# Patient Record
Sex: Male | Born: 1985 | Race: Black or African American | Hispanic: No | Marital: Single | Smoking: Never smoker
Health system: Southern US, Community
[De-identification: ages and names within clinical notes are randomized; demographics above are authoritative.]

## PROBLEM LIST (undated history)

## (undated) DIAGNOSIS — B20 Human immunodeficiency virus [HIV] disease: Secondary | ICD-10-CM

## (undated) DIAGNOSIS — R509 Fever, unspecified: Secondary | ICD-10-CM

## (undated) DIAGNOSIS — IMO0002 Reserved for concepts with insufficient information to code with codable children: Secondary | ICD-10-CM

## (undated) DIAGNOSIS — S022XXA Fracture of nasal bones, initial encounter for closed fracture: Secondary | ICD-10-CM

## (undated) HISTORY — DX: Human immunodeficiency virus (HIV) disease: B20

## (undated) HISTORY — DX: Fracture of nasal bones, initial encounter for closed fracture: S02.2XXA

## (undated) HISTORY — DX: Reserved for concepts with insufficient information to code with codable children: IMO0002

## (undated) HISTORY — DX: Fever, unspecified: R50.9

---
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
## 2009-01-02 ENCOUNTER — Emergency Department (HOSPITAL_COMMUNITY): Admission: EM | Admit: 2009-01-02 | Discharge: 2009-01-02 | Payer: Self-pay | Admitting: Emergency Medicine

## 2009-01-14 DIAGNOSIS — B20 Human immunodeficiency virus [HIV] disease: Secondary | ICD-10-CM

## 2009-01-14 HISTORY — DX: Human immunodeficiency virus (HIV) disease: B20

## 2009-01-15 ENCOUNTER — Emergency Department (HOSPITAL_COMMUNITY): Admission: EM | Admit: 2009-01-15 | Discharge: 2009-01-16 | Payer: Self-pay | Admitting: Emergency Medicine

## 2009-08-27 ENCOUNTER — Emergency Department (HOSPITAL_COMMUNITY): Admission: EM | Admit: 2009-08-27 | Discharge: 2009-08-27 | Payer: Self-pay | Admitting: Emergency Medicine

## 2009-11-20 ENCOUNTER — Emergency Department (HOSPITAL_COMMUNITY): Admission: EM | Admit: 2009-11-20 | Discharge: 2009-11-20 | Payer: Self-pay | Admitting: Emergency Medicine

## 2009-11-25 ENCOUNTER — Emergency Department (HOSPITAL_COMMUNITY): Admission: EM | Admit: 2009-11-25 | Discharge: 2009-11-25 | Payer: Self-pay | Admitting: Emergency Medicine

## 2009-11-28 ENCOUNTER — Ambulatory Visit: Payer: Self-pay | Admitting: Family Medicine

## 2009-11-28 ENCOUNTER — Observation Stay (HOSPITAL_COMMUNITY): Admission: EM | Admit: 2009-11-28 | Discharge: 2009-11-28 | Payer: Self-pay | Admitting: Emergency Medicine

## 2010-02-13 ENCOUNTER — Emergency Department (HOSPITAL_COMMUNITY)
Admission: EM | Admit: 2010-02-13 | Discharge: 2010-02-13 | Payer: Self-pay | Source: Home / Self Care | Admitting: Emergency Medicine

## 2010-02-23 ENCOUNTER — Telehealth: Payer: Self-pay

## 2010-02-23 DIAGNOSIS — B20 Human immunodeficiency virus [HIV] disease: Secondary | ICD-10-CM | POA: Insufficient documentation

## 2010-02-26 ENCOUNTER — Other Ambulatory Visit: Payer: Self-pay

## 2010-02-27 ENCOUNTER — Other Ambulatory Visit: Payer: Self-pay | Admitting: Adult Health

## 2010-02-27 ENCOUNTER — Other Ambulatory Visit (INDEPENDENT_AMBULATORY_CARE_PROVIDER_SITE_OTHER): Payer: Self-pay

## 2010-02-27 ENCOUNTER — Encounter: Payer: Self-pay | Admitting: Adult Health

## 2010-02-27 DIAGNOSIS — B20 Human immunodeficiency virus [HIV] disease: Secondary | ICD-10-CM

## 2010-02-27 LAB — CONVERTED CEMR LAB
Leukocytes, UA: NEGATIVE
Protein, ur: NEGATIVE mg/dL
Specific Gravity, Urine: 1.023 (ref 1.005–1.030)
Urine Glucose: NEGATIVE mg/dL
pH: 6 (ref 5.0–8.0)

## 2010-02-28 LAB — T-HELPER CELL (CD4) - (RCID CLINIC ONLY)
CD4 % Helper T Cell: 29 % — ABNORMAL LOW (ref 33–55)
CD4 T Cell Abs: 340 uL — ABNORMAL LOW (ref 400–2700)

## 2010-03-01 LAB — CONVERTED CEMR LAB
ALT: 20 units/L (ref 0–53)
Albumin: 4.3 g/dL (ref 3.5–5.2)
Basophils Relative: 0 % (ref 0–1)
CO2: 25 meq/L (ref 19–32)
Chloride: 107 meq/L (ref 96–112)
Eosinophils Absolute: 0.1 10*3/uL (ref 0.0–0.7)
Hep B Core Total Ab: NEGATIVE
Hepatitis B Surface Ag: NEGATIVE
Lymphs Abs: 1.2 10*3/uL (ref 0.7–4.0)
MCV: 86.2 fL (ref 78.0–100.0)
Neutrophils Relative %: 45 % (ref 43–77)
Platelets: 287 10*3/uL (ref 150–400)
Potassium: 3.8 meq/L (ref 3.5–5.3)
Sodium: 141 meq/L (ref 135–145)
Total Bilirubin: 0.3 mg/dL (ref 0.3–1.2)
Total Protein: 7.2 g/dL (ref 6.0–8.3)
WBC: 2.9 10*3/uL — ABNORMAL LOW (ref 4.0–10.5)

## 2010-03-01 NOTE — Progress Notes (Signed)
Summary: Transferring from [facility]   Phone Note Call from Patient   Summary of Call: Transfer from [facility]  Pt will be receiving medical care here and participating in study at [facility] . No intake , labs only.  Tomasita Morrow RN  February 23, 2010 3:13 PM   New Problems: HIV INFECTION (ICD-042)   New Problems: HIV INFECTION (ICD-042)

## 2010-03-12 ENCOUNTER — Ambulatory Visit: Payer: Self-pay | Admitting: Adult Health

## 2010-03-20 ENCOUNTER — Encounter: Payer: Self-pay | Admitting: Adult Health

## 2010-03-20 ENCOUNTER — Ambulatory Visit (INDEPENDENT_AMBULATORY_CARE_PROVIDER_SITE_OTHER): Payer: Self-pay | Admitting: Adult Health

## 2010-03-20 DIAGNOSIS — B20 Human immunodeficiency virus [HIV] disease: Secondary | ICD-10-CM

## 2010-03-20 LAB — CONVERTED CEMR LAB

## 2010-03-22 ENCOUNTER — Ambulatory Visit: Payer: Self-pay | Admitting: Adult Health

## 2010-03-27 LAB — DIFFERENTIAL
Basophils Relative: 1 % (ref 0–1)
Eosinophils Absolute: 0 10*3/uL (ref 0.0–0.7)
Eosinophils Absolute: 0 10*3/uL (ref 0.0–0.7)
Eosinophils Relative: 0 % (ref 0–5)
Eosinophils Relative: 1 % (ref 0–5)
Lymphs Abs: 0.8 10*3/uL (ref 0.7–4.0)
Monocytes Absolute: 0.6 10*3/uL (ref 0.1–1.0)
Monocytes Relative: 16 % — ABNORMAL HIGH (ref 3–12)
Monocytes Relative: 24 % — ABNORMAL HIGH (ref 3–12)
Neutrophils Relative %: 44 % (ref 43–77)

## 2010-03-27 LAB — BASIC METABOLIC PANEL
CO2: 27 mEq/L (ref 19–32)
Chloride: 103 mEq/L (ref 96–112)
GFR calc Af Amer: >60 mL/min (ref >60)
Sodium: 137 mEq/L (ref 135–145)

## 2010-03-27 LAB — URINALYSIS, ROUTINE W REFLEX MICROSCOPIC
Bilirubin Urine: NEGATIVE
Glucose, UA: NEGATIVE mg/dL
Ketones, ur: 15 mg/dL — AB
pH: 7.5 (ref 5.0–8.0)

## 2010-03-27 LAB — CBC
Hemoglobin: 14 g/dL (ref 13.0–17.0)
Hemoglobin: 14.6 g/dL (ref 13.0–17.0)
MCH: 28 pg (ref 26.0–34.0)
MCH: 28.2 pg (ref 26.0–34.0)
MCHC: 34.2 g/dL (ref 30.0–36.0)
MCV: 81.8 fL (ref 78.0–100.0)
MCV: 86.5 fL (ref 78.0–100.0)
RBC: 4.96 MIL/uL (ref 4.22–5.81)

## 2010-03-27 LAB — POCT I-STAT, CHEM 8
Calcium, Ion: 1.14 mmol/L (ref 1.12–1.32)
Creatinine, Ser: 0.9 mg/dL (ref 0.4–1.5)
Glucose, Bld: 87 mg/dL (ref 70–99)
HCT: 47 % (ref 39.0–52.0)
Hemoglobin: 16 g/dL (ref 13.0–17.0)
Potassium: 3.8 mEq/L (ref 3.5–5.1)
TCO2: 32 mmol/L (ref 0–100)

## 2010-03-27 LAB — URINE CULTURE: Culture: NO GROWTH

## 2010-03-27 LAB — URINE MICROSCOPIC-ADD ON

## 2010-04-01 LAB — DIFFERENTIAL
Basophils Absolute: 0 10*3/uL (ref 0.0–0.1)
Eosinophils Absolute: 0.1 10*3/uL (ref 0.0–0.7)
Eosinophils Relative: 1 % (ref 0–5)
Lymphs Abs: 1.3 10*3/uL (ref 0.7–4.0)
Neutrophils Relative %: 55 % (ref 43–77)

## 2010-04-01 LAB — COMPREHENSIVE METABOLIC PANEL
ALT: 11 U/L (ref 0–53)
AST: 21 U/L (ref 0–37)
CO2: 26 mEq/L (ref 19–32)
Calcium: 8.3 mg/dL — ABNORMAL LOW (ref 8.4–10.5)
Chloride: 108 mEq/L (ref 96–112)
Creatinine, Ser: 0.76 mg/dL (ref 0.4–1.5)
GFR calc Af Amer: >60 mL/min (ref >60)
GFR calc non Af Amer: >60 mL/min (ref >60)
Glucose, Bld: 93 mg/dL (ref 70–99)
Sodium: 139 mEq/L (ref 135–145)
Total Bilirubin: 0.6 mg/dL (ref 0.3–1.2)

## 2010-04-01 LAB — LIPASE, BLOOD: Lipase: 21 U/L (ref 11–59)

## 2010-04-01 LAB — URINALYSIS, ROUTINE W REFLEX MICROSCOPIC
Bilirubin Urine: NEGATIVE
Hgb urine dipstick: NEGATIVE
Specific Gravity, Urine: 1.027 (ref 1.005–1.030)
pH: 7 (ref 5.0–8.0)

## 2010-04-01 LAB — CBC
Hemoglobin: 14.2 g/dL (ref 13.0–17.0)
MCHC: 34.2 g/dL (ref 30.0–36.0)
MCV: 85 fL (ref 78.0–100.0)
RBC: 4.88 MIL/uL (ref 4.22–5.81)
WBC: 4.4 10*3/uL (ref 4.0–10.5)

## 2010-04-15 DIAGNOSIS — S022XXA Fracture of nasal bones, initial encounter for closed fracture: Secondary | ICD-10-CM

## 2010-04-15 DIAGNOSIS — IMO0002 Reserved for concepts with insufficient information to code with codable children: Secondary | ICD-10-CM

## 2010-04-15 HISTORY — DX: Reserved for concepts with insufficient information to code with codable children: IMO0002

## 2010-04-15 HISTORY — DX: Fracture of nasal bones, initial encounter for closed fracture: S02.2XXA

## 2010-04-15 HISTORY — PX: NASAL FRACTURE SURGERY: SHX718

## 2010-04-19 ENCOUNTER — Ambulatory Visit: Payer: Self-pay | Admitting: Adult Health

## 2010-04-20 ENCOUNTER — Emergency Department (HOSPITAL_COMMUNITY): Payer: Self-pay

## 2010-04-20 ENCOUNTER — Encounter (HOSPITAL_COMMUNITY): Payer: Self-pay | Admitting: Radiology

## 2010-04-20 ENCOUNTER — Inpatient Hospital Stay (HOSPITAL_COMMUNITY)
Admission: EM | Admit: 2010-04-20 | Discharge: 2010-04-25 | DRG: 154 | Disposition: A | Discharge status: Home / Self Care | Payer: Self-pay | Attending: General Surgery | Admitting: General Surgery

## 2010-04-20 ENCOUNTER — Inpatient Hospital Stay (HOSPITAL_COMMUNITY): Payer: Self-pay

## 2010-04-20 DIAGNOSIS — Z21 Asymptomatic human immunodeficiency virus [HIV] infection status: Secondary | ICD-10-CM | POA: Diagnosis present

## 2010-04-20 DIAGNOSIS — R404 Transient alteration of awareness: Secondary | ICD-10-CM | POA: Diagnosis not present

## 2010-04-20 DIAGNOSIS — S022XXA Fracture of nasal bones, initial encounter for closed fracture: Principal | ICD-10-CM | POA: Diagnosis present

## 2010-04-20 DIAGNOSIS — S060XAA Concussion with loss of consciousness status unknown, initial encounter: Secondary | ICD-10-CM | POA: Diagnosis present

## 2010-04-20 DIAGNOSIS — S0280XA Fracture of other specified skull and facial bones, unspecified side, initial encounter for closed fracture: Secondary | ICD-10-CM | POA: Diagnosis present

## 2010-04-20 DIAGNOSIS — J9600 Acute respiratory failure, unspecified whether with hypoxia or hypercapnia: Secondary | ICD-10-CM | POA: Diagnosis present

## 2010-04-20 DIAGNOSIS — S060X9A Concussion with loss of consciousness of unspecified duration, initial encounter: Secondary | ICD-10-CM | POA: Diagnosis present

## 2010-04-20 LAB — POCT I-STAT 3, ART BLOOD GAS (G3+)
pCO2 arterial: 32.4 mmHg — ABNORMAL LOW (ref 35.0–45.0)
pH, Arterial: 7.435 (ref 7.350–7.450)
pO2, Arterial: 324 mmHg — ABNORMAL HIGH (ref 80.0–100.0)

## 2010-04-20 LAB — BASIC METABOLIC PANEL
BUN: 7 mg/dL (ref 6–23)
Chloride: 109 mEq/L (ref 96–112)
GFR calc Af Amer: >60 mL/min (ref >60)
GFR calc non Af Amer: >60 mL/min (ref >60)
Potassium: 3.3 mEq/L — ABNORMAL LOW (ref 3.5–5.1)
Sodium: 135 mEq/L (ref 135–145)

## 2010-04-20 LAB — POCT I-STAT, CHEM 8
Glucose, Bld: 136 mg/dL — ABNORMAL HIGH (ref 70–99)
HCT: 49 % (ref 39.0–52.0)
Hemoglobin: 16.7 g/dL (ref 13.0–17.0)
Potassium: 3.6 meq/L (ref 3.5–5.1)
Sodium: 142 meq/L (ref 135–145)
TCO2: 8 mmol/L (ref 0–100)

## 2010-04-20 LAB — CARDIAC PANEL(CRET KIN+CKTOT+MB+TROPI)
CK, MB: 3 ng/mL (ref 0.3–4.0)
Relative Index: 0.9 (ref 0.0–2.5)
Total CK: 341 U/L — ABNORMAL HIGH (ref 7–232)
Troponin I: 0.01 ng/mL (ref 0.00–0.06)

## 2010-04-20 LAB — PROTIME-INR: Prothrombin Time: 17 seconds — ABNORMAL HIGH (ref 11.6–15.2)

## 2010-04-20 LAB — CBC
MCH: 28.8 pg (ref 26.0–34.0)
Platelets: 334 10*3/uL (ref 150–400)
RBC: 5.17 MIL/uL (ref 4.22–5.81)
RDW: 12.6 % (ref 11.5–15.5)
WBC: 10.2 10*3/uL (ref 4.0–10.5)

## 2010-04-20 LAB — LACTIC ACID, PLASMA: Lactic Acid, Venous: 6.2 mmol/L — ABNORMAL HIGH (ref 0.5–2.2)

## 2010-04-20 LAB — DIFFERENTIAL
Basophils Relative: 0 % (ref 0–1)
Monocytes Relative: 6 % (ref 3–12)
Neutro Abs: 9.5 10*3/uL — ABNORMAL HIGH (ref 1.7–7.7)
Neutrophils Relative %: 87 % — ABNORMAL HIGH (ref 43–77)

## 2010-04-20 LAB — COMPREHENSIVE METABOLIC PANEL
AST: 109 U/L — ABNORMAL HIGH (ref 0–37)
BUN: 9 mg/dL (ref 6–23)
CO2: 12 mEq/L — ABNORMAL LOW (ref 19–32)
Calcium: 9 mg/dL (ref 8.4–10.5)
Chloride: 104 mEq/L (ref 96–112)
Creatinine, Ser: 1.04 mg/dL (ref 0.4–1.5)
GFR calc Af Amer: >60 mL/min (ref >60)
GFR calc non Af Amer: >60 mL/min (ref >60)
Glucose, Bld: 143 mg/dL — ABNORMAL HIGH (ref 70–99)
Total Bilirubin: 0.2 mg/dL — ABNORMAL LOW (ref 0.3–1.2)

## 2010-04-20 LAB — MRSA PCR SCREENING: MRSA by PCR: NEGATIVE

## 2010-04-20 LAB — RAPID URINE DRUG SCREEN, HOSP PERFORMED
Amphetamines: NOT DETECTED
Barbiturates: NOT DETECTED
Opiates: NOT DETECTED

## 2010-04-20 LAB — ETHANOL: Alcohol, Ethyl (B): <5 mg/dL (ref 0–10)

## 2010-04-20 MED ORDER — IOHEXOL 300 MG/ML  SOLN
80.0000 mL | Freq: Once | INTRAMUSCULAR | Status: AC | PRN
  Administered 2010-04-20: 80 mL via INTRAVENOUS

## 2010-04-21 ENCOUNTER — Inpatient Hospital Stay (HOSPITAL_COMMUNITY): Payer: Self-pay

## 2010-04-21 LAB — BLOOD GAS, ARTERIAL
Acid-base deficit: 2.8 mmol/L — ABNORMAL HIGH (ref 0.0–2.0)
Bicarbonate: 22.1 mEq/L (ref 20.0–24.0)
FIO2: 0.3 %
TCO2: 23.4 mmol/L (ref 0–100)
pCO2 arterial: 42.5 mmHg (ref 35.0–45.0)
pH, Arterial: 7.336 — ABNORMAL LOW (ref 7.350–7.450)
pO2, Arterial: 153 mmHg — ABNORMAL HIGH (ref 80.0–100.0)

## 2010-04-21 LAB — TYPE AND SCREEN

## 2010-04-21 LAB — CBC
Hemoglobin: 12.5 g/dL — ABNORMAL LOW (ref 13.0–17.0)
MCH: 28 pg (ref 26.0–34.0)
Platelets: 247 10*3/uL (ref 150–400)
RBC: 4.47 MIL/uL (ref 4.22–5.81)
WBC: 11 10*3/uL — ABNORMAL HIGH (ref 4.0–10.5)

## 2010-04-21 LAB — CARDIAC PANEL(CRET KIN+CKTOT+MB+TROPI)
Relative Index: 0.9 (ref 0.0–2.5)
Relative Index: 0.8 (ref 0.0–2.5)
Total CK: 466 U/L — ABNORMAL HIGH (ref 7–232)
Troponin I: 0.01 ng/mL (ref 0.00–0.06)
Troponin I: 0.04 ng/mL (ref 0.00–0.06)

## 2010-04-23 DIAGNOSIS — S0280XA Fracture of other specified skull and facial bones, unspecified side, initial encounter for closed fracture: Secondary | ICD-10-CM

## 2010-04-23 DIAGNOSIS — R509 Fever, unspecified: Secondary | ICD-10-CM

## 2010-04-23 DIAGNOSIS — B20 Human immunodeficiency virus [HIV] disease: Secondary | ICD-10-CM

## 2010-04-23 LAB — T-HELPER CELLS (CD4) COUNT (NOT AT ARMC): CD4 T Cell Abs: 300 uL — ABNORMAL LOW (ref 400–2700)

## 2010-04-24 LAB — CBC
Hemoglobin: 12.9 g/dL — ABNORMAL LOW (ref 13.0–17.0)
MCH: 29.1 pg (ref 26.0–34.0)
MCV: 85.8 fL (ref 78.0–100.0)
Platelets: 217 10*3/uL (ref 150–400)
RBC: 4.44 MIL/uL (ref 4.22–5.81)
WBC: 4.1 10*3/uL (ref 4.0–10.5)

## 2010-04-26 ENCOUNTER — Ambulatory Visit: Payer: Self-pay | Admitting: Adult Health
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
## 2010-04-28 LAB — HIV-1 RNA, QUALITATIVE, TMA: HIV-1 RNA, Qualitative, TMA: UNDETERMINED

## 2010-05-01 ENCOUNTER — Ambulatory Visit (HOSPITAL_COMMUNITY): Admission: RE | Admit: 2010-05-01 | Payer: Self-pay | Source: Ambulatory Visit
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
## 2010-05-17 ENCOUNTER — Encounter: Payer: Self-pay | Admitting: Adult Health
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
## 2010-06-19 ENCOUNTER — Ambulatory Visit: Payer: Self-pay

## 2010-06-21 ENCOUNTER — Ambulatory Visit: Payer: Self-pay | Admitting: Adult Health

## 2010-07-05 ENCOUNTER — Ambulatory Visit: Payer: Self-pay

## 2010-07-05 ENCOUNTER — Ambulatory Visit: Payer: Self-pay | Admitting: Adult Health

## 2010-07-09 ENCOUNTER — Telehealth: Payer: Self-pay | Admitting: *Deleted

## 2010-07-09 NOTE — Telephone Encounter (Signed)
Pt. Has either cancelled or no showed for multiple appt.  Called pt and arranged appt for 0915, 07/11/10 w/ B. Sundra Aland, NP.  Pt verbalized understanding.  [facility] paperwork needing pt's signature to release information from visit there given to T. Dutch Quint, CMA for pt's signature.  Jennet Maduro, RN.

## 2010-07-11 ENCOUNTER — Ambulatory Visit: Payer: Self-pay | Admitting: Adult Health

## 2010-08-09 ENCOUNTER — Other Ambulatory Visit: Payer: Self-pay | Admitting: *Deleted

## 2010-08-09 DIAGNOSIS — B20 Human immunodeficiency virus [HIV] disease: Secondary | ICD-10-CM

## 2010-08-09 MED ORDER — EFAVIRENZ-EMTRICITAB-TENOFOVIR 600-200-300 MG PO TABS: 1.0000 | ORAL_TABLET | Freq: Every day | ORAL | Status: DC

## 2010-08-09 NOTE — Telephone Encounter (Signed)
This Rx was printed to go with patients ADAP application. Paul Farrell

## 2010-08-20 ENCOUNTER — Other Ambulatory Visit: Payer: Self-pay | Admitting: Licensed Clinical Social Worker

## 2010-08-20 DIAGNOSIS — B20 Human immunodeficiency virus [HIV] disease: Secondary | ICD-10-CM

## 2010-08-21 ENCOUNTER — Other Ambulatory Visit: Payer: Self-pay | Admitting: Adult Health

## 2010-08-21 ENCOUNTER — Other Ambulatory Visit (INDEPENDENT_AMBULATORY_CARE_PROVIDER_SITE_OTHER): Payer: Self-pay

## 2010-08-21 DIAGNOSIS — B20 Human immunodeficiency virus [HIV] disease: Secondary | ICD-10-CM

## 2010-08-21 LAB — COMPREHENSIVE METABOLIC PANEL
Albumin: 4.2 g/dL (ref 3.5–5.2)
BUN: 6 mg/dL (ref 6–23)
CO2: 28 mEq/L (ref 19–32)
Calcium: 9.4 mg/dL (ref 8.4–10.5)
Chloride: 103 mEq/L (ref 96–112)
Glucose, Bld: 113 mg/dL — ABNORMAL HIGH (ref 70–99)
Potassium: 4.1 mEq/L (ref 3.5–5.3)
Sodium: 137 mEq/L (ref 135–145)
Total Protein: 6.7 g/dL (ref 6.0–8.3)

## 2010-08-22 LAB — CBC WITH DIFFERENTIAL/PLATELET
HCT: 43.2 % (ref 39.0–52.0)
Hemoglobin: 14.1 g/dL (ref 13.0–17.0)
Lymphs Abs: 1 10*3/uL (ref 0.7–4.0)
Monocytes Absolute: 0.3 10*3/uL (ref 0.1–1.0)
Monocytes Relative: 9 % (ref 3–12)
Neutro Abs: 1.9 10*3/uL (ref 1.7–7.7)
Neutrophils Relative %: 58 % (ref 43–77)
RBC: 4.9 MIL/uL (ref 4.22–5.81)

## 2010-08-23 LAB — HIV-1 RNA QUANT-NO REFLEX-BLD
HIV 1 RNA Quant: <20 copies/mL (ref <20)
HIV-1 RNA Quant, Log: <1.3 {Log} (ref <1.30)

## 2010-09-04 ENCOUNTER — Ambulatory Visit: Payer: Self-pay

## 2010-09-04 ENCOUNTER — Encounter: Payer: Self-pay | Admitting: Adult Health

## 2010-09-04 ENCOUNTER — Ambulatory Visit (INDEPENDENT_AMBULATORY_CARE_PROVIDER_SITE_OTHER): Payer: Self-pay | Admitting: Adult Health

## 2010-09-04 VITALS — BP 138/79 | HR 60 | Temp 98.1°F | Ht 68.0 in | Wt 126.0 lb

## 2010-09-04 DIAGNOSIS — Z23 Encounter for immunization: Secondary | ICD-10-CM

## 2010-09-04 DIAGNOSIS — B20 Human immunodeficiency virus [HIV] disease: Secondary | ICD-10-CM

## 2010-09-19 ENCOUNTER — Ambulatory Visit: Payer: Self-pay | Admitting: Adult Health

## 2010-09-20 ENCOUNTER — Ambulatory Visit: Payer: Self-pay

## 2010-09-28 ENCOUNTER — Emergency Department (HOSPITAL_COMMUNITY)
Admission: EM | Admit: 2010-09-28 | Discharge: 2010-09-29 | Disposition: A | Discharge status: Home / Self Care | Payer: Self-pay | Attending: Emergency Medicine | Admitting: Emergency Medicine

## 2010-09-28 DIAGNOSIS — Z21 Asymptomatic human immunodeficiency virus [HIV] infection status: Secondary | ICD-10-CM | POA: Insufficient documentation

## 2010-09-28 DIAGNOSIS — K5900 Constipation, unspecified: Secondary | ICD-10-CM | POA: Insufficient documentation

## 2010-09-28 DIAGNOSIS — K6289 Other specified diseases of anus and rectum: Secondary | ICD-10-CM | POA: Insufficient documentation

## 2010-10-09 ENCOUNTER — Emergency Department (HOSPITAL_COMMUNITY)
Admission: EM | Admit: 2010-10-09 | Discharge: 2010-10-10 | Disposition: A | Discharge status: Home / Self Care | Payer: Self-pay | Attending: Emergency Medicine | Admitting: Emergency Medicine

## 2010-10-09 DIAGNOSIS — IMO0001 Reserved for inherently not codable concepts without codable children: Secondary | ICD-10-CM | POA: Insufficient documentation

## 2010-10-09 DIAGNOSIS — R070 Pain in throat: Secondary | ICD-10-CM | POA: Insufficient documentation

## 2010-10-09 DIAGNOSIS — Z21 Asymptomatic human immunodeficiency virus [HIV] infection status: Secondary | ICD-10-CM | POA: Insufficient documentation

## 2010-10-09 DIAGNOSIS — J3489 Other specified disorders of nose and nasal sinuses: Secondary | ICD-10-CM | POA: Insufficient documentation

## 2010-10-09 DIAGNOSIS — B9789 Other viral agents as the cause of diseases classified elsewhere: Secondary | ICD-10-CM | POA: Insufficient documentation

## 2010-10-09 DIAGNOSIS — R51 Headache: Secondary | ICD-10-CM | POA: Insufficient documentation

## 2010-10-15 ENCOUNTER — Ambulatory Visit (INDEPENDENT_AMBULATORY_CARE_PROVIDER_SITE_OTHER): Payer: Self-pay | Admitting: Infectious Disease

## 2010-10-15 ENCOUNTER — Encounter: Payer: Self-pay | Admitting: Infectious Disease

## 2010-10-15 VITALS — BP 130/77 | HR 69 | Temp 97.9°F | Wt 129.0 lb

## 2010-10-15 DIAGNOSIS — B20 Human immunodeficiency virus [HIV] disease: Secondary | ICD-10-CM

## 2010-10-15 DIAGNOSIS — T7491XA Unspecified adult maltreatment, confirmed, initial encounter: Secondary | ICD-10-CM

## 2010-10-15 DIAGNOSIS — Z23 Encounter for immunization: Secondary | ICD-10-CM

## 2010-10-15 DIAGNOSIS — IMO0002 Reserved for concepts with insufficient information to code with codable children: Secondary | ICD-10-CM

## 2010-10-15 DIAGNOSIS — S022XXA Fracture of nasal bones, initial encounter for closed fracture: Secondary | ICD-10-CM | POA: Insufficient documentation
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
## 2010-10-15 NOTE — Assessment & Plan Note (Signed)
Check viral load, restart atripla recheck viral load and cd4 in one month. Give hep a vax
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
## 2010-10-17 LAB — HIV-1 RNA QUANT-NO REFLEX-BLD: HIV-1 RNA Quant, Log: 4.14 {Log} — ABNORMAL HIGH (ref <1.30)

## 2010-10-23 ENCOUNTER — Other Ambulatory Visit: Payer: Self-pay | Admitting: *Deleted

## 2010-10-23 DIAGNOSIS — B20 Human immunodeficiency virus [HIV] disease: Secondary | ICD-10-CM

## 2010-10-23 MED ORDER — EFAVIRENZ-EMTRICITAB-TENOFOVIR 600-200-300 MG PO TABS: 1.0000 | ORAL_TABLET | Freq: Every day | ORAL | Status: DC

## 2011-02-19 NOTE — Progress Notes (Signed)
Subjective:    Patient ID: Paul Farrell is a 26 y.o. male.  Chief Complaint: HIV Follow-up Visit Paul Farrell is here for follow-up of HIV infection. He is feeling unchanged since his last visit.  He claims continued adherence to therapy with good tolerance and no complications. There are not additional complaints.   Data Review: Diagnostic studies reviewed.  Review of Systems - General ROS: negative for - fatigue or malaise Psychological ROS: negative for - anxiety, behavioral disorder, concentration difficulties, depression or mood swings Ophthalmic ROS: negative for - blurry vision or decreased vision ENT ROS: negative Respiratory ROS: no cough, shortness of breath, or wheezing Cardiovascular ROS: no chest pain or dyspnea on exertion Gastrointestinal ROS: no abdominal pain, change in bowel habits, or black or bloody stools Neurological ROS: negative Dermatological ROS: negative for rash and skin lesion changes  Objective:   General appearance: alert, cooperative and no distress Head: Normocephalic, without obvious abnormality, atraumatic Eyes: conjunctivae/corneas clear. PERRL, EOM's intact. Fundi benign. Ears: normal TM's and external ear canals both ears Throat: lips, mucosa, and tongue normal; teeth and gums normal Resp: clear to auscultation bilaterally Cardio: regular rate and rhythm, S1, S2 normal, no murmur, click, rub or gallop GI: soft, non-tender; bowel sounds normal; no masses,  no organomegaly Extremities: extremities normal, atraumatic, no cyanosis or edema Skin: Skin color, texture, turgor normal. No rashes or lesions Neurologic: Alert and oriented X 3, normal strength and tone. Normal symmetric reflexes. Normal coordination and gait Psych:  No vegetative signs or delusional behaviors noted.    Laboratory: From 08/21/2010 ,  CD4 count was 320 c/cmm @ 30 %. Viral load <20 copies/ml.     Assessment/Plan:   HIV INFECTION Clinically stable on  current regimen. Continue present management.  Counseling provided on prevention of transmission of HIV. Condoms offered:  Yes Medication adherence discussed with patient.  Follow up visit in 4 months with labs 2 weeks prior to appointment. Patient acknowledged information provided to them and agreed with plan of care.      Emanii Bugbee A. Sundra Aland, MS, [facility] [phone]  02/19/2011, 8:57 AM

## 2011-02-19 NOTE — Assessment & Plan Note (Signed)
Clinically stable on current regimen. Continue present management.  Counseling provided on prevention of transmission of HIV. Condoms offered:  Yes Medication adherence discussed with patient.  Follow up visit in 4 months with labs 2 weeks prior to appointment. Patient acknowledged information provided to them and agreed with plan of care.    

## 2011-02-25 ENCOUNTER — Other Ambulatory Visit (INDEPENDENT_AMBULATORY_CARE_PROVIDER_SITE_OTHER): Payer: Self-pay

## 2011-02-25 DIAGNOSIS — B20 Human immunodeficiency virus [HIV] disease: Secondary | ICD-10-CM

## 2011-02-25 LAB — CBC WITH DIFFERENTIAL/PLATELET
Basophils Relative: 0 % (ref 0–1)
Eosinophils Absolute: 0 10*3/uL (ref 0.0–0.7)
Lymphs Abs: 1.5 10*3/uL (ref 0.7–4.0)
MCH: 28.2 pg (ref 26.0–34.0)
Neutro Abs: 1.1 10*3/uL — ABNORMAL LOW (ref 1.7–7.7)
Neutrophils Relative %: 36 % — ABNORMAL LOW (ref 43–77)
Platelets: 303 10*3/uL (ref 150–400)
RBC: 4.93 MIL/uL (ref 4.22–5.81)
WBC: 3 10*3/uL — ABNORMAL LOW (ref 4.0–10.5)

## 2011-02-25 LAB — COMPLETE METABOLIC PANEL WITH GFR
ALT: 28 U/L (ref 0–53)
Alkaline Phosphatase: 97 U/L (ref 39–117)
CO2: 27 mEq/L (ref 19–32)
Potassium: 3.8 mEq/L (ref 3.5–5.3)
Sodium: 141 mEq/L (ref 135–145)
Total Bilirubin: 0.3 mg/dL (ref 0.3–1.2)
Total Protein: 7.1 g/dL (ref 6.0–8.3)

## 2011-02-26 ENCOUNTER — Encounter: Payer: Self-pay | Admitting: Family Medicine

## 2011-02-26 LAB — T-HELPER CELL (CD4) - (RCID CLINIC ONLY)
CD4 % Helper T Cell: 25 % — ABNORMAL LOW (ref 33–55)
CD4 T Cell Abs: 440 uL (ref 400–2700)

## 2011-03-11 ENCOUNTER — Ambulatory Visit: Payer: Self-pay

## 2011-03-11 ENCOUNTER — Encounter: Payer: Self-pay | Admitting: Family Medicine

## 2011-03-11 ENCOUNTER — Ambulatory Visit (INDEPENDENT_AMBULATORY_CARE_PROVIDER_SITE_OTHER): Payer: Self-pay | Admitting: Family Medicine

## 2011-03-11 ENCOUNTER — Ambulatory Visit: Payer: Self-pay | Admitting: Adult Health

## 2011-03-11 DIAGNOSIS — R51 Headache: Secondary | ICD-10-CM

## 2011-03-11 DIAGNOSIS — R519 Headache, unspecified: Secondary | ICD-10-CM | POA: Insufficient documentation

## 2011-03-11 DIAGNOSIS — B20 Human immunodeficiency virus [HIV] disease: Secondary | ICD-10-CM

## 2011-03-11 DIAGNOSIS — K922 Gastrointestinal hemorrhage, unspecified: Secondary | ICD-10-CM

## 2011-03-11 LAB — LIPID PANEL
Cholesterol: 161 mg/dL (ref 0–200)
Triglycerides: 54 mg/dL (ref <150)

## 2011-03-11 MED ORDER — ACETAMINOPHEN 500 MG PO TABS: 1000.0000 mg | ORAL_TABLET | Freq: Four times a day (QID) | ORAL | Status: AC | PRN

## 2011-03-11 MED ORDER — CYCLOBENZAPRINE HCL 10 MG PO TABS: 10.0000 mg | ORAL_TABLET | Freq: Three times a day (TID) | ORAL | Status: AC | PRN

## 2011-03-11 NOTE — Patient Instructions (Signed)
Livan,  Thank you very much for coming to see me today.  Headaches: regular sleep schedule (sleep during the night with a goal of 8 hrs), tylenol as needed for severe/toothaches headaches try to use only once day and flexeril (try to use a night, do not take if driving).   Blood in stools: STOP Goody's, BCs, ibuprofen (advil, motrin) all NSAIDs. Drinking milk is helpful. If H. Pylori is positive I will treat it as long as the treatment does not interfere with your current medications.   F/u in 6 weeks. If you are still having blood in your stool I will send you to the GI doctor for evaluation.   Dr. Armen Pickup
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
## 2011-03-12 ENCOUNTER — Encounter: Payer: Self-pay | Admitting: Family Medicine

## 2011-03-13 ENCOUNTER — Encounter: Payer: Self-pay | Admitting: Family Medicine

## 2011-03-14 ENCOUNTER — Ambulatory Visit: Payer: Self-pay | Admitting: Adult Health

## 2011-03-14 ENCOUNTER — Telehealth: Payer: Self-pay | Admitting: *Deleted

## 2011-03-14 ENCOUNTER — Ambulatory Visit: Payer: Self-pay | Admitting: Internal Medicine

## 2011-03-14 ENCOUNTER — Ambulatory Visit (INDEPENDENT_AMBULATORY_CARE_PROVIDER_SITE_OTHER): Payer: Self-pay | Admitting: Internal Medicine

## 2011-03-14 ENCOUNTER — Encounter: Payer: Self-pay | Admitting: Internal Medicine

## 2011-03-14 ENCOUNTER — Ambulatory Visit: Payer: Self-pay

## 2011-03-14 VITALS — BP 135/90 | HR 51 | Temp 97.9°F | Ht 70.0 in | Wt 126.5 lb

## 2011-03-14 DIAGNOSIS — B20 Human immunodeficiency virus [HIV] disease: Secondary | ICD-10-CM

## 2011-03-14 NOTE — Progress Notes (Signed)
  Subjective:    Patient ID: Paul Farrell, male    DOB: 1985-10-06, 26 y.o.   MRN: [id number]  HPI here for follow up of 042. Previously a patient at [facility] and now comig here.  He had beenout of his medicines in October but restarted the Atripla and reports excellent adherence and tolerance.  Viral load now undetectable again.  CD4 440.  No new issues.      Review of Systems  Constitutional: Negative for fever, chills, appetite change, fatigue and unexpected weight change.  HENT: Negative for sore throat and trouble swallowing.        Just had 2 teeth pulled  Respiratory: Negative for chest tightness, shortness of breath and wheezing.   Cardiovascular: Negative for chest pain, palpitations and leg swelling.  Gastrointestinal: Negative for nausea, vomiting and abdominal pain.  Musculoskeletal: Negative for myalgias and arthralgias.  Skin: Negative for pallor and rash.  Neurological: Negative for weakness, light-headedness and headaches.  Hematological: Negative for adenopathy.  Psychiatric/Behavioral: Negative for dysphoric mood. The patient is not nervous/anxious.        Objective:   Physical Exam  Constitutional: He appears well-developed and well-nourished. No distress.  Cardiovascular: Normal rate, regular rhythm and normal heart sounds.  Exam reveals no gallop and no friction rub.   No murmur heard. Pulmonary/Chest: Effort normal and breath sounds normal. No respiratory distress. He has no wheezes. He has no rales.  Abdominal: Soft. Bowel sounds are normal. He exhibits no distension. There is no tenderness. There is no rebound.  Lymphadenopathy:    He has no cervical adenopathy.          Assessment & Plan:

## 2011-03-14 NOTE — Patient Instructions (Signed)
Follow up in 3 months

## 2011-03-14 NOTE — Telephone Encounter (Signed)
Called patient and left message to reschedule his appointment, which was a f/u on his labs.  Asked him to request Dr. Daiva Eves who he has seen previously, as Dr. Luciana Axe has no available appointments for March. Wendall Mola CMA

## 2011-03-14 NOTE — Assessment & Plan Note (Signed)
He is doing well and good compliance.  He will return in 3 months to assure no resistance.

## 2011-03-17 ENCOUNTER — Encounter: Payer: Self-pay | Admitting: Family Medicine

## 2011-03-17 NOTE — Assessment & Plan Note (Signed)
A: present for 2 weeks. Suspect headaches related to tooth pathology. No evidence of sinus infection (fever, nasal discharge). No red flags for intracranial pathology.  P: -improve sleep habits: goal 8 hrs a night.  -stop NSAIDs. -trial of muscle relaxer and tylenol for pain.

## 2011-03-17 NOTE — Assessment & Plan Note (Addendum)
A: lower GI bleed associated with intermittent L sided pain. ? Gastric ulcer vs. NSAID overuse vs. Occult malignancy. Malignancy less likely given his age but possible given his recent immunocompromised state (lapse in HIV treatment). He is not a drinker or illicit drug user.  P:  -stop all NSAIDs  - H. Pylori test for ? Gastritis/ulcers. Treat if positive. Hold off on PPI/H2 blocker for now.  -increase milk -close f/u in 6 weeks. If bleeding improves/resolves in 6 weeks. Will follow clinically. If not will do anoscopic exam and refer to GI.

## 2011-04-02 ENCOUNTER — Emergency Department (HOSPITAL_COMMUNITY)
Admission: EM | Admit: 2011-04-02 | Discharge: 2011-04-02 | Disposition: A | Discharge status: Home / Self Care | Payer: Self-pay | Attending: Emergency Medicine | Admitting: Emergency Medicine

## 2011-04-02 ENCOUNTER — Encounter (HOSPITAL_COMMUNITY): Payer: Self-pay | Admitting: *Deleted

## 2011-04-02 DIAGNOSIS — Z21 Asymptomatic human immunodeficiency virus [HIV] infection status: Secondary | ICD-10-CM | POA: Insufficient documentation

## 2011-04-02 DIAGNOSIS — K0889 Other specified disorders of teeth and supporting structures: Secondary | ICD-10-CM

## 2011-04-02 DIAGNOSIS — K089 Disorder of teeth and supporting structures, unspecified: Secondary | ICD-10-CM | POA: Insufficient documentation

## 2011-04-02 MED ORDER — OXYCODONE-ACETAMINOPHEN 5-325 MG PO TABS: 1.0000 | ORAL_TABLET | ORAL | Status: AC | PRN
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
## 2011-04-02 NOTE — Discharge Instructions (Signed)
Dental Pain A tooth ache may be caused by cavities (tooth decay). Cavities expose the nerve of the tooth to air and hot or cold temperatures. It may come from an infection or abscess (also called a boil or furuncle) around your tooth. It is also often caused by dental caries (tooth decay). This causes the pain you are having. DIAGNOSIS  Your caregiver can diagnose this problem by exam. TREATMENT   If caused by an infection, it may be treated with medications which kill germs (antibiotics) and pain medications as prescribed by your caregiver. Take medications as directed.   Only take over-the-counter or prescription medicines for pain, discomfort, or fever as directed by your caregiver.   Whether the tooth ache today is caused by infection or dental disease, you should see your dentist as soon as possible for further care.  SEEK MEDICAL CARE IF: The exam and treatment you received today has been provided on an emergency basis only. This is not a substitute for complete medical or dental care. If your problem worsens or new problems (symptoms) appear, and you are unable to meet with your dentist, call or return to this location. SEEK IMMEDIATE MEDICAL CARE IF:   You have a fever.   You develop redness and swelling of your face, jaw, or neck.   You are unable to open your mouth.   You have severe pain uncontrolled by pain medicine.  MAKE SURE YOU:   Understand these instructions.   Will watch your condition.   Will get help right away if you are not doing well or get worse.  Document Released: 12/31/2004 Document Revised: 12/20/2010 Document Reviewed: 08/19/2007 [employer] Patient Information 2012 [employer].  Return for any new or worsening symptoms or any other concerns.  

## 2011-04-02 NOTE — ED Notes (Signed)
The pt had a tooth filled today and since then the pain has increased

## 2011-04-03 NOTE — ED Provider Notes (Signed)
I saw and evaluated the patient, reviewed the resident's note and I agree with the findings and plan.  Pt seen and examined- s/p multiple dental fillings today with pain in upper and lower left gums- dental block performed by Dr. Jamas Lav, resident- pt with complete pain relief- no abscess on exam or sublingular swelling.  Pt plans to call dentist in AM to arrange for follow up.   Ethelda Chick, MD 04/03/11 [phone]

## 2011-04-15 ENCOUNTER — Encounter: Payer: Self-pay | Admitting: Family Medicine

## 2011-04-15 DIAGNOSIS — K0889 Other specified disorders of teeth and supporting structures: Secondary | ICD-10-CM | POA: Insufficient documentation

## 2011-04-22 ENCOUNTER — Other Ambulatory Visit: Payer: Self-pay | Admitting: Internal Medicine

## 2011-04-22 DIAGNOSIS — B20 Human immunodeficiency virus [HIV] disease: Secondary | ICD-10-CM

## 2011-04-24 ENCOUNTER — Ambulatory Visit: Payer: Self-pay | Admitting: Family Medicine

## 2011-04-29 ENCOUNTER — Other Ambulatory Visit: Payer: Self-pay | Admitting: Licensed Clinical Social Worker

## 2011-04-29 DIAGNOSIS — B20 Human immunodeficiency virus [HIV] disease: Secondary | ICD-10-CM

## 2011-04-29 MED ORDER — EFAVIRENZ-EMTRICITAB-TENOFOVIR 600-200-300 MG PO TABS: 1.0000 | ORAL_TABLET | Freq: Every day | ORAL | Status: DC

## 2011-05-12 ENCOUNTER — Encounter (HOSPITAL_COMMUNITY): Payer: Self-pay | Admitting: Emergency Medicine

## 2011-05-12 ENCOUNTER — Emergency Department (HOSPITAL_COMMUNITY): Payer: Self-pay

## 2011-05-12 ENCOUNTER — Emergency Department (HOSPITAL_COMMUNITY)
Admission: EM | Admit: 2011-05-12 | Discharge: 2011-05-12 | Disposition: A | Discharge status: Home / Self Care | Payer: Self-pay | Attending: Emergency Medicine | Admitting: Emergency Medicine

## 2011-05-12 DIAGNOSIS — M791 Myalgia, unspecified site: Secondary | ICD-10-CM

## 2011-05-12 DIAGNOSIS — Z21 Asymptomatic human immunodeficiency virus [HIV] infection status: Secondary | ICD-10-CM | POA: Insufficient documentation

## 2011-05-12 DIAGNOSIS — R05 Cough: Secondary | ICD-10-CM | POA: Insufficient documentation

## 2011-05-12 DIAGNOSIS — R059 Cough, unspecified: Secondary | ICD-10-CM | POA: Insufficient documentation

## 2011-05-12 DIAGNOSIS — IMO0001 Reserved for inherently not codable concepts without codable children: Secondary | ICD-10-CM | POA: Insufficient documentation

## 2011-05-12 DIAGNOSIS — J3489 Other specified disorders of nose and nasal sinuses: Secondary | ICD-10-CM | POA: Insufficient documentation

## 2011-05-12 LAB — COMPREHENSIVE METABOLIC PANEL
ALT: 35 U/L (ref 0–53)
AST: 35 U/L (ref 0–37)
Albumin: 3.9 g/dL (ref 3.5–5.2)
Alkaline Phosphatase: 105 U/L (ref 39–117)
BUN: 5 mg/dL — ABNORMAL LOW (ref 6–23)
CO2: 28 mEq/L (ref 19–32)
Calcium: 9.1 mg/dL (ref 8.4–10.5)
Chloride: 103 mEq/L (ref 96–112)
Creatinine, Ser: 0.72 mg/dL (ref 0.50–1.35)
GFR calc Af Amer: >90 mL/min (ref >90)
GFR calc non Af Amer: >90 mL/min (ref >90)
Glucose, Bld: 84 mg/dL (ref 70–99)
Potassium: 3.9 mEq/L (ref 3.5–5.1)
Sodium: 139 mEq/L (ref 135–145)
Total Bilirubin: 0.2 mg/dL — ABNORMAL LOW (ref 0.3–1.2)
Total Protein: 7.4 g/dL (ref 6.0–8.3)

## 2011-05-12 LAB — CBC
HCT: 43.9 % (ref 39.0–52.0)
Hemoglobin: 14.6 g/dL (ref 13.0–17.0)
MCH: 29.1 pg (ref 26.0–34.0)
MCHC: 33.3 g/dL (ref 30.0–36.0)
MCV: 87.5 fL (ref 78.0–100.0)
Platelets: 316 10*3/uL (ref 150–400)
RBC: 5.02 MIL/uL (ref 4.22–5.81)
RDW: 12.3 % (ref 11.5–15.5)
WBC: 4.1 10*3/uL (ref 4.0–10.5)

## 2011-05-12 LAB — URINALYSIS, ROUTINE W REFLEX MICROSCOPIC
Bilirubin Urine: NEGATIVE
Glucose, UA: NEGATIVE mg/dL
Hgb urine dipstick: NEGATIVE
Ketones, ur: NEGATIVE mg/dL
Leukocytes, UA: NEGATIVE
Nitrite: NEGATIVE
Protein, ur: NEGATIVE mg/dL
Specific Gravity, Urine: 1.021 (ref 1.005–1.030)
Urobilinogen, UA: 0.2 mg/dL (ref 0.0–1.0)
pH: 8 (ref 5.0–8.0)

## 2011-05-12 LAB — DIFFERENTIAL
Basophils Absolute: 0 10*3/uL (ref 0.0–0.1)
Basophils Relative: 1 % (ref 0–1)
Eosinophils Absolute: 0.1 10*3/uL (ref 0.0–0.7)
Eosinophils Relative: 2 % (ref 0–5)
Lymphocytes Relative: 45 % (ref 12–46)
Lymphs Abs: 1.8 10*3/uL (ref 0.7–4.0)
Monocytes Absolute: 0.4 10*3/uL (ref 0.1–1.0)
Monocytes Relative: 10 % (ref 3–12)
Neutro Abs: 1.8 10*3/uL (ref 1.7–7.7)
Neutrophils Relative %: 43 % (ref 43–77)
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
## 2011-05-12 NOTE — ED Notes (Signed)
Patient with generalized body aches.  Patient states he did have some diarrhea.  Patient states he does not get sick.

## 2011-05-12 NOTE — Discharge Instructions (Signed)
Please read over the instructions below. All of your bloodwork and urine test tonight were normal. The chest xray was normal as well. The Gonorrhea and Chlamydia culture will result in a couple of days. If one or both are positive you will be notified by phone. Return for worsening symptoms, otherwise follow up with your doctor at the infectious disease clinic as planned.  Myalgia, Adult Myalgia is the medical term for muscle pain. It is a symptom of many things. Nearly everyone at some time in their life has this. The most common cause for muscle pain is overuse or straining and more so when you are not in shape. Injuries and muscle bruises cause myalgias. Muscle pain without a history of injury can also be caused by a virus. It frequently comes along with the flu. Myalgia not caused by muscle strain can be present in a large number of infectious diseases. Some autoimmune diseases like lupus and fibromyalgia can cause muscle pain. Myalgia may be mild, or severe. SYMPTOMS  The symptoms of myalgia are simply muscle pain. Most of the time this is short lived and the pain goes away without treatment. DIAGNOSIS  Myalgia is diagnosed by your caregiver by taking your history. This means you tell him when the problems began, what they are, and what has been happening. If this has not been a long term problem, your caregiver may want to watch for a while to see what will happen. If it has been long term, they may want to do additional testing. TREATMENT  The treatment depends on what the underlying cause of the muscle pain is. Often anti-inflammatory medications will help. HOME CARE INSTRUCTIONS  If the pain in your muscles came from overuse, slow down your activities until the problems go away.   Myalgia from overuse of a muscle can be treated with alternating hot and cold packs on the muscle affected or with cold for the first couple days. If either heat or cold seems to make things worse, stop their use.     Apply ice to the sore area for 15 to 20 minutes, 3 to 4 times per day, while awake for the first 2 days of muscle soreness, or as directed. Put the ice in a plastic bag and place a towel between the bag of ice and your skin.   Only take over-the-counter or prescription medicines for pain, discomfort, or fever as directed by your caregiver.   Regular gentle exercise may help if you are not active.   Stretching before strenuous exercise can help lower the risk of myalgia. It is normal when beginning an exercise regimen to feel some muscle pain after exercising. Muscles that have not been used frequently will be sore at first. If the pain is extreme, this may mean injury to a muscle.  SEEK MEDICAL CARE IF:  You have an increase in muscle pain that is not relieved with medication.   You begin to run a temperature.   You develop nausea and vomiting.   You develop a stiff and painful neck.   You develop a rash.   You develop muscle pain after a tick bite.   You have continued muscle pain while working out even after you are in good condition.  SEEK IMMEDIATE MEDICAL CARE IF: Any of your problems are getting worse and medications are not helping. MAKE SURE YOU:   Understand these instructions.   Will watch your condition.   Will get help right away if you are not  doing well or get worse.  Document Released: 11/22/2005 Document Revised: 12/20/2010 Document Reviewed: 02/11/2006 [facility] Patient Information 2012 [place], [place].

## 2011-05-13 LAB — GC/CHLAMYDIA PROBE AMP, GENITAL
Chlamydia, DNA Probe: NEGATIVE
GC Probe Amp, Genital: NEGATIVE

## 2011-05-14 NOTE — ED Provider Notes (Signed)
Medical screening examination/treatment/procedure(s) were performed by non-physician practitioner and as supervising physician I was immediately available for consultation/collaboration.   Ethelean Colla R Elmin Wiederholt, MD 05/14/11 0829 
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
## 2011-05-28 ENCOUNTER — Other Ambulatory Visit: Payer: Self-pay

## 2011-06-11 ENCOUNTER — Ambulatory Visit: Payer: Self-pay | Admitting: Internal Medicine

## 2011-06-11 ENCOUNTER — Telehealth: Payer: Self-pay | Admitting: *Deleted

## 2011-06-11 NOTE — Telephone Encounter (Signed)
Referral made to [facility] for multiple no shows. Jayson Waterhouse CMA  

## 2011-06-11 NOTE — Telephone Encounter (Signed)
Please disregard previous phone note, patient did keep appointment. Paul Farrell

## 2011-07-03 ENCOUNTER — Other Ambulatory Visit: Payer: Self-pay

## 2011-07-11 ENCOUNTER — Ambulatory Visit: Payer: Self-pay | Admitting: Family Medicine

## 2011-07-23 ENCOUNTER — Ambulatory Visit: Payer: Self-pay | Admitting: Internal Medicine

## 2011-08-13 ENCOUNTER — Other Ambulatory Visit: Payer: Self-pay

## 2011-08-13 ENCOUNTER — Ambulatory Visit: Payer: Self-pay

## 2011-08-13 DIAGNOSIS — F4310 Post-traumatic stress disorder, unspecified: Secondary | ICD-10-CM

## 2011-08-13 DIAGNOSIS — F331 Major depressive disorder, recurrent, moderate: Secondary | ICD-10-CM

## 2011-08-13 DIAGNOSIS — B20 Human immunodeficiency virus [HIV] disease: Secondary | ICD-10-CM

## 2011-08-13 LAB — COMPLETE METABOLIC PANEL WITH GFR
ALT: 33 U/L (ref 0–53)
AST: 29 U/L (ref 0–37)
Alkaline Phosphatase: 78 U/L (ref 39–117)
BUN: 10 mg/dL (ref 6–23)
Chloride: 106 mEq/L (ref 96–112)
Creat: 0.84 mg/dL (ref 0.50–1.35)
Total Bilirubin: 0.2 mg/dL — ABNORMAL LOW (ref 0.3–1.2)

## 2011-08-13 LAB — CBC WITH DIFFERENTIAL/PLATELET
Basophils Absolute: 0 10*3/uL (ref 0.0–0.1)
Basophils Relative: 0 % (ref 0–1)
Eosinophils Relative: 1 % (ref 0–5)
HCT: 40 % (ref 39.0–52.0)
Lymphocytes Relative: 49 % — ABNORMAL HIGH (ref 12–46)
MCHC: 34.8 g/dL (ref 30.0–36.0)
MCV: 82.6 fL (ref 78.0–100.0)
Monocytes Absolute: 0.4 10*3/uL (ref 0.1–1.0)
Platelets: 290 10*3/uL (ref 150–400)
RDW: 12.7 % (ref 11.5–15.5)
WBC: 3.9 10*3/uL — ABNORMAL LOW (ref 4.0–10.5)

## 2011-08-13 LAB — RPR
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
## 2011-08-14 LAB — T-HELPER CELL (CD4) - (RCID CLINIC ONLY)
CD4 % Helper T Cell: 30 % — ABNORMAL LOW (ref 33–55)
CD4 T Cell Abs: 570 uL (ref 400–2700)

## 2011-08-15 LAB — HIV-1 RNA ULTRAQUANT REFLEX TO GENTYP+: HIV-1 RNA Quant, Log: <1.3 {Log} (ref <1.30)

## 2011-08-20 ENCOUNTER — Ambulatory Visit: Payer: Self-pay

## 2011-08-27 ENCOUNTER — Ambulatory Visit: Payer: Self-pay | Admitting: Internal Medicine

## 2011-08-28 ENCOUNTER — Telehealth: Payer: Self-pay | Admitting: Licensed Clinical Social Worker

## 2011-08-28 NOTE — Telephone Encounter (Signed)
I called patient and left a message on his voicemail to call and reschedule his missed appointment.

## 2011-10-27 ENCOUNTER — Encounter (HOSPITAL_COMMUNITY): Payer: Self-pay | Admitting: Emergency Medicine

## 2011-10-27 ENCOUNTER — Emergency Department (HOSPITAL_COMMUNITY)
Admission: EM | Admit: 2011-10-27 | Discharge: 2011-10-27 | Disposition: A | Discharge status: Home / Self Care | Payer: Self-pay | Attending: Emergency Medicine | Admitting: Emergency Medicine

## 2011-10-27 DIAGNOSIS — L03119 Cellulitis of unspecified part of limb: Secondary | ICD-10-CM | POA: Insufficient documentation

## 2011-10-27 DIAGNOSIS — R197 Diarrhea, unspecified: Secondary | ICD-10-CM | POA: Insufficient documentation

## 2011-10-27 DIAGNOSIS — Z21 Asymptomatic human immunodeficiency virus [HIV] infection status: Secondary | ICD-10-CM | POA: Insufficient documentation

## 2011-10-27 DIAGNOSIS — L02419 Cutaneous abscess of limb, unspecified: Secondary | ICD-10-CM | POA: Insufficient documentation

## 2011-10-27 DIAGNOSIS — Z8489 Family history of other specified conditions: Secondary | ICD-10-CM | POA: Insufficient documentation

## 2011-10-27 DIAGNOSIS — Z825 Family history of asthma and other chronic lower respiratory diseases: Secondary | ICD-10-CM | POA: Insufficient documentation

## 2011-10-27 DIAGNOSIS — Z818 Family history of other mental and behavioral disorders: Secondary | ICD-10-CM | POA: Insufficient documentation

## 2011-10-27 DIAGNOSIS — Z91013 Allergy to seafood: Secondary | ICD-10-CM | POA: Insufficient documentation

## 2011-10-27 DIAGNOSIS — Z800 Family history of malignant neoplasm of digestive organs: Secondary | ICD-10-CM | POA: Insufficient documentation

## 2011-10-27 DIAGNOSIS — Z803 Family history of malignant neoplasm of breast: Secondary | ICD-10-CM | POA: Insufficient documentation

## 2011-10-27 DIAGNOSIS — Z8249 Family history of ischemic heart disease and other diseases of the circulatory system: Secondary | ICD-10-CM | POA: Insufficient documentation

## 2011-10-27 MED ORDER — LIDOCAINE-EPINEPHRINE 2 %-1:100000 IJ SOLN
20.0000 mL | Freq: Once | INTRAMUSCULAR | Status: AC
  Administered 2011-10-27: 5 mL
  Filled 2011-10-27: qty 20
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
## 2011-10-27 NOTE — ED Notes (Signed)
Xylocaine and I&D tray at bedside

## 2011-10-27 NOTE — ED Notes (Signed)
C/o diarrhea x 2 days and abscess on R upper leg x 1 week.

## 2011-10-27 NOTE — ED Notes (Signed)
Patient refusing to allow staff to take discharge vital signs.  Patient standing in hall way, walking around.  Informed patient of the need for him to stay within his room at this time.  Patient requesting to get his discharge paperwork and leave, stating he is not wanting to stay within his room because "I am figgin to leave."  RN Thayer Ohm informed.

## 2011-10-27 NOTE — ED Notes (Signed)
Pt states understanding of discharge instructions 

## 2011-10-31 ENCOUNTER — Ambulatory Visit: Payer: Self-pay | Admitting: Internal Medicine

## 2011-10-31 ENCOUNTER — Telehealth: Payer: Self-pay | Admitting: *Deleted

## 2011-10-31 NOTE — Telephone Encounter (Signed)
Called patient and left voice mail to call the clinic to reschedule his appt, he no showed today. Jacqueline Cockerham  

## 2011-11-08 ENCOUNTER — Other Ambulatory Visit: Payer: Self-pay

## 2011-11-11 ENCOUNTER — Other Ambulatory Visit: Payer: Self-pay | Admitting: *Deleted

## 2011-11-11 DIAGNOSIS — B20 Human immunodeficiency virus [HIV] disease: Secondary | ICD-10-CM

## 2011-11-11 MED ORDER — EFAVIRENZ-EMTRICITAB-TENOFOVIR 600-200-300 MG PO TABS: 1.0000 | ORAL_TABLET | Freq: Every day | ORAL | Status: DC

## 2011-11-18 ENCOUNTER — Ambulatory Visit: Payer: Self-pay | Admitting: Family Medicine

## 2011-11-21 ENCOUNTER — Telehealth: Payer: Self-pay | Admitting: *Deleted

## 2011-11-21 ENCOUNTER — Ambulatory Visit: Payer: Self-pay | Admitting: Internal Medicine

## 2011-11-21 NOTE — Telephone Encounter (Signed)
Called patient and left voice mail to call the clinic to reschedule lab and MD visit.  Also, referred to [facility] for multiple no shows. Wendall Mola CMA

## 2011-11-26 ENCOUNTER — Other Ambulatory Visit: Payer: Self-pay

## 2011-11-28 ENCOUNTER — Other Ambulatory Visit (INDEPENDENT_AMBULATORY_CARE_PROVIDER_SITE_OTHER): Payer: Self-pay

## 2011-11-28 DIAGNOSIS — B20 Human immunodeficiency virus [HIV] disease: Secondary | ICD-10-CM

## 2011-11-28 LAB — CBC WITH DIFFERENTIAL/PLATELET
Basophils Absolute: 0 10*3/uL (ref 0.0–0.1)
Basophils Relative: 0 % (ref 0–1)
HCT: 41.6 % (ref 39.0–52.0)
Lymphocytes Relative: 49 % — ABNORMAL HIGH (ref 12–46)
MCHC: 34.4 g/dL (ref 30.0–36.0)
Monocytes Absolute: 0.4 10*3/uL (ref 0.1–1.0)
Neutro Abs: 1.1 10*3/uL — ABNORMAL LOW (ref 1.7–7.7)
Neutrophils Relative %: 37 % — ABNORMAL LOW (ref 43–77)
Platelets: 300 10*3/uL (ref 150–400)
RDW: 13 % (ref 11.5–15.5)
WBC: 3 10*3/uL — ABNORMAL LOW (ref 4.0–10.5)

## 2011-11-28 LAB — COMPREHENSIVE METABOLIC PANEL
ALT: 29 U/L (ref 0–53)
AST: 28 U/L (ref 0–37)
Albumin: 4.3 g/dL (ref 3.5–5.2)
Alkaline Phosphatase: 72 U/L (ref 39–117)
BUN: 13 mg/dL (ref 6–23)
Chloride: 104 mEq/L (ref 96–112)
Creat: 0.76 mg/dL (ref 0.50–1.35)
Potassium: 4.4 mEq/L (ref 3.5–5.3)

## 2011-12-02 LAB — HIV-1 RNA QUANT-NO REFLEX-BLD
HIV 1 RNA Quant: <20 copies/mL (ref <20)
HIV-1 RNA Quant, Log: <1.3 {Log} (ref <1.30)

## 2011-12-09 ENCOUNTER — Other Ambulatory Visit: Payer: Self-pay | Admitting: *Deleted

## 2011-12-09 DIAGNOSIS — B20 Human immunodeficiency virus [HIV] disease: Secondary | ICD-10-CM

## 2011-12-09 MED ORDER — EFAVIRENZ-EMTRICITAB-TENOFOVIR 600-200-300 MG PO TABS: 1.0000 | ORAL_TABLET | Freq: Every day | ORAL | Status: DC

## 2011-12-17 ENCOUNTER — Encounter: Payer: Self-pay | Admitting: Internal Medicine

## 2011-12-17 ENCOUNTER — Ambulatory Visit (INDEPENDENT_AMBULATORY_CARE_PROVIDER_SITE_OTHER): Payer: No Typology Code available for payment source | Admitting: Internal Medicine

## 2011-12-17 VITALS — BP 105/73 | HR 69 | Temp 98.0°F | Ht 69.0 in | Wt 124.0 lb

## 2011-12-17 DIAGNOSIS — Z23 Encounter for immunization: Secondary | ICD-10-CM

## 2011-12-17 DIAGNOSIS — B20 Human immunodeficiency virus [HIV] disease: Secondary | ICD-10-CM

## 2011-12-17 NOTE — Assessment & Plan Note (Signed)
No new issues, he will return in 6 months for routine followup.

## 2011-12-17 NOTE — Progress Notes (Signed)
  Subjective:    Patient ID: Paul Farrell, male    DOB: 1985-08-17, 26 y.o.   MRN: [id number]  HPI He comes in for followup of his HIV. He had been on Atripla but then had run out and transferred care here. I restarted him on Atripla last year and he was supposed to followup to assure that there was no resistance but now comes in for than 6 months later on Atripla and with good compliance. He has remained undetectable with a good CD4 count. He has no complaints today.   Review of Systems  Constitutional: Negative for fever.  HENT: Negative for sore throat and trouble swallowing.   Respiratory: Negative for cough and shortness of breath.   Gastrointestinal: Negative for nausea, abdominal pain and diarrhea.  Skin: Negative for rash.  Psychiatric/Behavioral: The patient is not nervous/anxious.        Objective:   Physical Exam  Constitutional: He appears well-developed and well-nourished. No distress.  HENT:  Mouth/Throat: No oropharyngeal exudate.  Cardiovascular: Normal rate, regular rhythm and normal heart sounds.  Exam reveals no gallop and no friction rub.   No murmur heard. Pulmonary/Chest: Effort normal and breath sounds normal. No respiratory distress. He has no wheezes.          Assessment & Plan:

## 2011-12-18 ENCOUNTER — Other Ambulatory Visit: Payer: Self-pay | Admitting: *Deleted

## 2011-12-18 DIAGNOSIS — B20 Human immunodeficiency virus [HIV] disease: Secondary | ICD-10-CM

## 2011-12-18 MED ORDER — EFAVIRENZ-EMTRICITAB-TENOFOVIR 600-200-300 MG PO TABS: 1.0000 | ORAL_TABLET | Freq: Every day | ORAL | Status: DC

## 2012-01-22 ENCOUNTER — Telehealth: Payer: Self-pay | Admitting: *Deleted

## 2012-01-22 NOTE — Telephone Encounter (Signed)
Pt called triage line asking what he can take OTC for a 4 day history of Nausea (no vomiting or heaves), diarrhea, and "dry sinuses." Pt denies fever, chills, myalgias.  Please advise. Andree Coss, RN

## 2012-01-22 NOTE — Telephone Encounter (Signed)
pepto bismol.  He can take anything OTC, there will be no specific interaction with his Atripla.

## 2012-01-22 NOTE — Telephone Encounter (Signed)
Patient advised. Thank you.

## 2012-06-24 ENCOUNTER — Other Ambulatory Visit: Payer: Self-pay

## 2012-07-07 ENCOUNTER — Ambulatory Visit: Payer: Self-pay | Admitting: Internal Medicine

## 2012-09-07 ENCOUNTER — Other Ambulatory Visit (INDEPENDENT_AMBULATORY_CARE_PROVIDER_SITE_OTHER): Payer: Self-pay

## 2012-09-07 DIAGNOSIS — B20 Human immunodeficiency virus [HIV] disease: Secondary | ICD-10-CM

## 2012-09-07 NOTE — Addendum Note (Signed)
Addended bySteva Colder on: 09/07/2012 11:45 AM   Modules accepted: Orders

## 2012-09-08 LAB — HIV-1 RNA QUANT-NO REFLEX-BLD
HIV 1 RNA Quant: <20 copies/mL (ref <20)
HIV-1 RNA Quant, Log: <1.3 {Log} (ref <1.30)

## 2012-09-08 LAB — T-HELPER CELL (CD4) - (RCID CLINIC ONLY): CD4 % Helper T Cell: 31 % — ABNORMAL LOW (ref 33–55)

## 2012-11-30 ENCOUNTER — Ambulatory Visit: Payer: Self-pay | Admitting: Internal Medicine

## 2012-11-30 ENCOUNTER — Telehealth: Payer: Self-pay | Admitting: *Deleted

## 2012-11-30 NOTE — Telephone Encounter (Signed)
Left patient a voice mail to call the clinic to reschedule appt, no showed today. Paul Farrell

## 2013-01-06 ENCOUNTER — Other Ambulatory Visit: Payer: Self-pay | Admitting: Internal Medicine

## 2013-02-05 ENCOUNTER — Other Ambulatory Visit: Payer: Self-pay | Admitting: Internal Medicine

## 2013-02-08 ENCOUNTER — Other Ambulatory Visit: Payer: Self-pay | Admitting: *Deleted

## 2013-02-08 DIAGNOSIS — B20 Human immunodeficiency virus [HIV] disease: Secondary | ICD-10-CM

## 2013-02-08 MED ORDER — EFAVIRENZ-EMTRICITAB-TENOFOVIR 600-200-300 MG PO TABS: ORAL_TABLET | ORAL | Status: DC

## 2013-02-08 NOTE — Telephone Encounter (Signed)
Pt has lab work appt scheduled for 02/15/13.

## 2013-02-15 ENCOUNTER — Other Ambulatory Visit (INDEPENDENT_AMBULATORY_CARE_PROVIDER_SITE_OTHER): Payer: Self-pay

## 2013-02-15 ENCOUNTER — Other Ambulatory Visit: Payer: Self-pay | Admitting: Internal Medicine

## 2013-02-15 DIAGNOSIS — B20 Human immunodeficiency virus [HIV] disease: Secondary | ICD-10-CM

## 2013-02-15 DIAGNOSIS — Z113 Encounter for screening for infections with a predominantly sexual mode of transmission: Secondary | ICD-10-CM

## 2013-02-15 LAB — CBC WITH DIFFERENTIAL/PLATELET
Basophils Absolute: 0 10*3/uL (ref 0.0–0.1)
Basophils Relative: 0 % (ref 0–1)
Eosinophils Absolute: 0 10*3/uL (ref 0.0–0.7)
Eosinophils Relative: 1 % (ref 0–5)
HEMATOCRIT: 41.7 % (ref 39.0–52.0)
HEMOGLOBIN: 13.7 g/dL (ref 13.0–17.0)
LYMPHS ABS: 2 10*3/uL (ref 0.7–4.0)
Lymphocytes Relative: 43 % (ref 12–46)
MCH: 28.1 pg (ref 26.0–34.0)
MCHC: 32.9 g/dL (ref 30.0–36.0)
MCV: 85.6 fL (ref 78.0–100.0)
MONO ABS: 0.6 10*3/uL (ref 0.1–1.0)
MONOS PCT: 13 % — AB (ref 3–12)
NEUTROS ABS: 1.9 10*3/uL (ref 1.7–7.7)
Neutrophils Relative %: 43 % (ref 43–77)
Platelets: 304 10*3/uL (ref 150–400)
RBC: 4.87 MIL/uL (ref 4.22–5.81)
RDW: 13.4 % (ref 11.5–15.5)
WBC: 4.6 10*3/uL (ref 4.0–10.5)

## 2013-02-15 LAB — COMPREHENSIVE METABOLIC PANEL
ALT: 25 U/L (ref 0–53)
AST: 25 U/L (ref 0–37)
Albumin: 4.2 g/dL (ref 3.5–5.2)
Alkaline Phosphatase: 64 U/L (ref 39–117)
BUN: 14 mg/dL (ref 6–23)
CALCIUM: 9.3 mg/dL (ref 8.4–10.5)
CHLORIDE: 104 meq/L (ref 96–112)
CO2: 26 mEq/L (ref 19–32)
CREATININE: 0.87 mg/dL (ref 0.50–1.35)
Glucose, Bld: 89 mg/dL (ref 70–99)
Potassium: 4 mEq/L (ref 3.5–5.3)
Sodium: 140 mEq/L (ref 135–145)
Total Bilirubin: 0.3 mg/dL (ref 0.2–1.2)
Total Protein: 7.3 g/dL (ref 6.0–8.3)

## 2013-02-15 LAB — RPR

## 2013-02-16 LAB — T-HELPER CELL (CD4) - (RCID CLINIC ONLY)
CD4 % Helper T Cell: 30 % — ABNORMAL LOW (ref 33–55)
CD4 T CELL ABS: 590 /uL (ref 400–2700)

## 2013-02-17 LAB — HIV-1 RNA QUANT-NO REFLEX-BLD
HIV 1 RNA Quant: <20 copies/mL (ref <20)
HIV-1 RNA Quant, Log: <1.3 {Log} (ref <1.30)

## 2013-03-04 ENCOUNTER — Other Ambulatory Visit: Payer: Self-pay | Admitting: Internal Medicine

## 2013-03-16 ENCOUNTER — Ambulatory Visit: Payer: Self-pay | Admitting: Internal Medicine

## 2013-04-10 ENCOUNTER — Other Ambulatory Visit: Payer: Self-pay | Admitting: Internal Medicine

## 2013-04-12 ENCOUNTER — Other Ambulatory Visit: Payer: Self-pay | Admitting: Internal Medicine

## 2013-04-12 ENCOUNTER — Telehealth: Payer: Self-pay | Admitting: *Deleted

## 2013-04-12 DIAGNOSIS — B20 Human immunodeficiency virus [HIV] disease: Secondary | ICD-10-CM

## 2013-04-12 NOTE — Telephone Encounter (Signed)
Patient called requesting refill of Atripla.  Patient hasn't been seen by a physician since 12/2011, but came for labs on 02/15/13.  RN advised patient that a physician would need to see him before we could authorize additional refills, that labs weren't enough.  Patient stated he felt that MD visits were "useless, because they just tell me to keep doing what I am doing."  RN suggested that he discuss future plan of care at his upcoming appointment with Dr. Luciana Axeomer. RN authorized 30 days of Atripla with the understanding that the patient needs to keep his appointment on 4/13.  Per Allen Oldisha, patient is up to date on ADAP viat [facility]. Andree CossHowell, Gurney Balthazor M, RN

## 2013-04-26 ENCOUNTER — Telehealth: Payer: Self-pay | Admitting: *Deleted

## 2013-04-26 ENCOUNTER — Ambulatory Visit: Payer: Self-pay | Admitting: Internal Medicine

## 2013-04-26 DIAGNOSIS — B20 Human immunodeficiency virus [HIV] disease: Secondary | ICD-10-CM

## 2013-04-26 MED ORDER — EFAVIRENZ-EMTRICITAB-TENOFOVIR 600-200-300 MG PO TABS: ORAL_TABLET | ORAL | Status: DC

## 2013-04-26 NOTE — Telephone Encounter (Signed)
Patient no-showed today's appointment, left message notifying him.  Patient is undetectable, has had recent labs (02/2013), but has not seen a physician since 12/2011. Per Dr Luciana Axeomer, patient needs to have an appointment in August (labs same day as visit, NOT 2 weeks prior).  OK to continue his medications through August - [facility]. Andree CossMichelle M Escher Harr, RN

## 2013-09-05 ENCOUNTER — Other Ambulatory Visit: Payer: Self-pay | Admitting: Internal Medicine

## 2013-09-11 ENCOUNTER — Other Ambulatory Visit: Payer: Self-pay | Admitting: Internal Medicine

## 2013-09-13 ENCOUNTER — Other Ambulatory Visit (INDEPENDENT_AMBULATORY_CARE_PROVIDER_SITE_OTHER): Payer: Self-pay

## 2013-09-13 DIAGNOSIS — Z113 Encounter for screening for infections with a predominantly sexual mode of transmission: Secondary | ICD-10-CM

## 2013-09-13 DIAGNOSIS — Z79899 Other long term (current) drug therapy: Secondary | ICD-10-CM

## 2013-09-13 DIAGNOSIS — B20 Human immunodeficiency virus [HIV] disease: Secondary | ICD-10-CM

## 2013-09-13 LAB — CBC WITH DIFFERENTIAL/PLATELET
Basophils Absolute: 0 10*3/uL (ref 0.0–0.1)
Basophils Relative: 0 % (ref 0–1)
Eosinophils Absolute: 0 10*3/uL (ref 0.0–0.7)
Eosinophils Relative: 1 % (ref 0–5)
HCT: 42.9 % (ref 39.0–52.0)
Hemoglobin: 14.5 g/dL (ref 13.0–17.0)
LYMPHS ABS: 1.2 10*3/uL (ref 0.7–4.0)
LYMPHS PCT: 38 % (ref 12–46)
MCH: 28.8 pg (ref 26.0–34.0)
MCHC: 33.8 g/dL (ref 30.0–36.0)
MCV: 85.3 fL (ref 78.0–100.0)
Monocytes Absolute: 0.4 10*3/uL (ref 0.1–1.0)
Monocytes Relative: 12 % (ref 3–12)
NEUTROS PCT: 49 % (ref 43–77)
Neutro Abs: 1.5 10*3/uL — ABNORMAL LOW (ref 1.7–7.7)
PLATELETS: 296 10*3/uL (ref 150–400)
RBC: 5.03 MIL/uL (ref 4.22–5.81)
RDW: 13.5 % (ref 11.5–15.5)
WBC: 3.1 10*3/uL — ABNORMAL LOW (ref 4.0–10.5)

## 2013-09-13 LAB — LIPID PANEL
CHOLESTEROL: 169 mg/dL (ref 0–200)
HDL: 67 mg/dL (ref >39)
LDL Cholesterol: 88 mg/dL (ref 0–99)
Total CHOL/HDL Ratio: 2.5 Ratio
Triglycerides: 69 mg/dL (ref <150)
VLDL: 14 mg/dL (ref 0–40)

## 2013-09-13 LAB — COMPREHENSIVE METABOLIC PANEL
ALT: 19 U/L (ref 0–53)
AST: 22 U/L (ref 0–37)
Albumin: 4.1 g/dL (ref 3.5–5.2)
Alkaline Phosphatase: 69 U/L (ref 39–117)
BUN: 8 mg/dL (ref 6–23)
CALCIUM: 9 mg/dL (ref 8.4–10.5)
CHLORIDE: 104 meq/L (ref 96–112)
CO2: 28 meq/L (ref 19–32)
Creat: 0.86 mg/dL (ref 0.50–1.35)
Glucose, Bld: 82 mg/dL (ref 70–99)
Potassium: 4.2 mEq/L (ref 3.5–5.3)
SODIUM: 138 meq/L (ref 135–145)
Total Bilirubin: 0.2 mg/dL (ref 0.2–1.2)
Total Protein: 7 g/dL (ref 6.0–8.3)

## 2013-09-14 ENCOUNTER — Other Ambulatory Visit: Payer: Self-pay | Admitting: Internal Medicine

## 2013-09-14 DIAGNOSIS — B20 Human immunodeficiency virus [HIV] disease: Secondary | ICD-10-CM

## 2013-09-14 LAB — T-HELPER CELL (CD4) - (RCID CLINIC ONLY)
CD4 T CELL HELPER: 28 % — AB (ref 33–55)
CD4 T Cell Abs: 360 /uL — ABNORMAL LOW (ref 400–2700)

## 2013-09-15 ENCOUNTER — Other Ambulatory Visit: Payer: Self-pay | Admitting: *Deleted

## 2013-09-15 DIAGNOSIS — B20 Human immunodeficiency virus [HIV] disease: Secondary | ICD-10-CM

## 2013-09-15 LAB — HIV-1 RNA QUANT-NO REFLEX-BLD: HIV-1 RNA Quant, Log: <1.3 {Log} (ref <1.30)

## 2013-09-15 MED ORDER — EFAVIRENZ-EMTRICITAB-TENOFOVIR 600-200-300 MG PO TABS: ORAL_TABLET | ORAL | Status: DC

## 2013-10-16 ENCOUNTER — Other Ambulatory Visit: Payer: Self-pay | Admitting: Internal Medicine

## 2013-10-16 DIAGNOSIS — B20 Human immunodeficiency virus [HIV] disease: Secondary | ICD-10-CM

## 2013-11-03 ENCOUNTER — Ambulatory Visit: Payer: Self-pay | Admitting: Internal Medicine

## 2013-11-20 ENCOUNTER — Other Ambulatory Visit: Payer: Self-pay | Admitting: Internal Medicine

## 2013-11-29 ENCOUNTER — Telehealth: Payer: Self-pay | Admitting: *Deleted

## 2013-11-29 ENCOUNTER — Other Ambulatory Visit: Payer: Self-pay | Admitting: Internal Medicine

## 2013-11-29 NOTE — Telephone Encounter (Signed)
Pharmacy requested refill of Atripla.  Patient's last office visit 12/2011.  He has had many warnings that he needs to be seen by a physician for refills, will make appointments, come for labs, but no show physician appointments. When RN spoke with patient, he stated it is "too stressful to just come there for a doctor to tell me 'doing great, keep taking your meds, see you in 6 months' and I just don't need that stress." Last labs 08/2013, VL <20, CD4=360.   RN advised that the patient would have to speak with a physician about the frequency of doctor visits.  Patient states he wants the doctor to call him, not actually come into the office.  RN advised that he would have to speak with someone regarding this arrangement, that it is not our protocol. Will forward to Tomasita Morrowammy King, RN, [facility] and Dr. Luciana Axeomer for advice.  Dr. Luciana Axeomer, please advise about refills of Atripla in the mean time. Andree CossHowell, Lakelynn Severtson M, RN

## 2013-11-30 NOTE — Telephone Encounter (Signed)
He should get his ADAP renewal, labs and Dr. Visit on the same day twice a year since he has to come in to renew then.  He can get refills until then, I say.

## 2013-12-01 ENCOUNTER — Telehealth: Payer: Self-pay | Admitting: *Deleted

## 2013-12-01 ENCOUNTER — Other Ambulatory Visit: Payer: Self-pay | Admitting: *Deleted

## 2013-12-01 DIAGNOSIS — B20 Human immunodeficiency virus [HIV] disease: Secondary | ICD-10-CM

## 2013-12-01 MED ORDER — EFAVIRENZ-EMTRICITAB-TENOFOVIR 600-200-300 MG PO TABS: 1.0000 | ORAL_TABLET | Freq: Every day | ORAL | Status: AC

## 2013-12-01 NOTE — Telephone Encounter (Signed)
Spoke with patient regarding Dr. Ephriam Knucklesomer's advice to come in for labs, ADAP and MD appointment all on same day every 6 months.  He is quite agreeable to this, prefers early in the am to reduce likelihood of running into some one at the office.  Pt scheduled for 01/27/14 at 8:45. Will renew ADAP after MD visit.  Andree CossHowell, Zonya Gudger M, RN

## 2013-12-01 NOTE — Telephone Encounter (Signed)
He is very agreeable to this. Scheduled, refilled meds.

## 2013-12-22 ENCOUNTER — Telehealth: Payer: Self-pay | Admitting: *Deleted

## 2013-12-22 NOTE — Telephone Encounter (Signed)
Patient called and advised he has a rash on his arms, hands and legs. He advised he has been taking his medication (Atripla) for 2-3 weeks again after a 3 week break. He wants the doctor to call him about what to so. Advised the patient we have an appt today at 315 or [phone] if he wants to come in but we can not diagnose a rash over the phone and rashes can come from anywhere. The patient advised he is out of town until after the holidays but that he went to the ED there and was advised it is probably from the medication and they could not advise him to keep taking or stop the medication. Advised the patient he can either come back to town and call us to be seen or we can see him when he gets back to town, we can not treat a rash unseen over the phone. He advised he will think about it and give us a call back.

## 2013-12-23 ENCOUNTER — Telehealth: Payer: Self-pay | Admitting: *Deleted

## 2013-12-23 NOTE — Telephone Encounter (Signed)
Patient called back. He went to the ED in [place], states everything was ok, but that he still has a rash.  Patient spoke with Ulyses SouthwardMinh Pham, pharmacist for advice.  Per DaconoMinh, patient unlikely to be having drug rash.  He gave the patient advice for OTC treatment until patient comes for follow up 01/27/14.  Of note, patient states the rash is "spots" and is on his hands and feet.  Patient is in [place] until the evening of 12/23. Andree CossHowell, Regan Mcbryar M, RN

## 2014-01-27 ENCOUNTER — Ambulatory Visit: Payer: Self-pay

## 2014-01-27 ENCOUNTER — Ambulatory Visit: Payer: Self-pay | Admitting: Internal Medicine

## 2014-03-01 ENCOUNTER — Telehealth: Payer: Self-pay | Admitting: *Deleted

## 2014-03-01 ENCOUNTER — Ambulatory Visit: Payer: Self-pay

## 2014-03-01 ENCOUNTER — Ambulatory Visit: Payer: Self-pay | Admitting: Internal Medicine

## 2014-03-01 NOTE — Telephone Encounter (Signed)
Unable to leave message on voice mail. Last office visit 12/2011.  Patient referred to bridge. Andree CossHowell, Victory Dresden M, RN

## 2014-04-17 ENCOUNTER — Other Ambulatory Visit: Payer: Self-pay | Admitting: Internal Medicine
